# Patient Record
Sex: Male | Born: 1980 | Hispanic: No | Marital: Married | Smoking: Light tobacco smoker
Health system: Southern US, Community
[De-identification: ages and names within clinical notes are randomized; demographics above are authoritative.]

## PROBLEM LIST (undated history)

## (undated) HISTORY — PX: APPENDECTOMY: SHX54

---

## 2012-09-23 ENCOUNTER — Ambulatory Visit (INDEPENDENT_AMBULATORY_CARE_PROVIDER_SITE_OTHER): Payer: Managed Care, Other (non HMO) | Admitting: Internal Medicine

## 2012-09-23 VITALS — BP 122/78 | HR 78 | Temp 97.6°F | Resp 16 | Ht 67.5 in | Wt 173.0 lb

## 2012-09-23 DIAGNOSIS — J069 Acute upper respiratory infection, unspecified: Secondary | ICD-10-CM

## 2012-09-23 DIAGNOSIS — J029 Acute pharyngitis, unspecified: Secondary | ICD-10-CM

## 2012-09-23 LAB — POCT RAPID STREP A (OFFICE): Rapid Strep A Screen: NEGATIVE

## 2012-09-23 NOTE — Progress Notes (Signed)
  Subjective:    Patient ID: Logan Garrison, male    DOB: Nov 01, 1980, 32 y.o.   MRN: [id number]  HPI Here with translator Complaining of two-day history of congestion sore throat No cough No fever  No ongoing illnesses Review of Systems     Objective:   Physical Exam Vital signs stable Eyes clear TMs clear Nares boggy with clear rhinorrhea Oropharynx red with slight exudate 1+ a.c. nodes tender  Rapid strep= Results for orders placed in visit on 09/23/12  POCT RAPID STREP A (OFFICE)      Result Value Range   Rapid Strep A Screen Negative  Negative        Assessment & Plan:  Viral pharyngitis/uri  sudafed

## 2012-12-24 ENCOUNTER — Ambulatory Visit (INDEPENDENT_AMBULATORY_CARE_PROVIDER_SITE_OTHER): Payer: Managed Care, Other (non HMO) | Admitting: Family Medicine

## 2012-12-24 VITALS — BP 108/60 | HR 66 | Temp 97.6°F | Resp 18 | Ht 67.5 in | Wt 167.0 lb

## 2012-12-24 DIAGNOSIS — J329 Chronic sinusitis, unspecified: Secondary | ICD-10-CM

## 2012-12-24 MED ORDER — AMOXICILLIN 875 MG PO TABS: 875.0000 mg | ORAL_TABLET | Freq: Two times a day (BID) | ORAL | Status: DC

## 2012-12-24 NOTE — Progress Notes (Signed)
    Patient ID: Logan Garrison MRN: [id number], DOB: [birth year], 32 y.o. Date of Encounter: 12/24/2012, 10:54 AM  Primary Physician: No PCP Per Patient  Chief Complaint:  Chief Complaint  Patient presents with  . headache, congestion, nose bleeds    x4 months since he came to [place]    HPI: 32 y.o. year old male presents with 7 day history of nasal congestion, post nasal drip, sore throat, sinus pressure, and cough. Afebrile. No chills. Nasal congestion thick and green/yellow. Sinus pressure is the worst symptom. Cough is productive secondary to post nasal drip and not associated with time of day. Ears feel full, leading to sensation of muffled hearing. Has tried OTC cold preps without success. No GI complaints.  H/O nasal fx with deviated septum 5 years ago after soccer mishap.; No recent antibiotics, recent travels, or sick contacts   No leg trauma, sedentary periods, h/o cancer, or tobacco use.  History reviewed. No pertinent past medical history.   Home Meds: Prior to Admission medications   Medication Sig Start Date End Date Taking? Authorizing Provider  amoxicillin (AMOXIL) 875 MG tablet Take 1 tablet (875 mg total) by mouth 2 (two) times daily. 12/24/12   Elvina Sidle, MD    Allergies: No Known Allergies  History   Social History  . Marital Status: Married    Spouse Name: N/A    Number of Children: N/A  . Years of Education: N/A   Occupational History  . Not on file.   Social History Main Topics  . Smoking status: Never Smoker   . Smokeless tobacco: Not on file  . Alcohol Use: No  . Drug Use: No  . Sexually Active: Not on file   Other Topics Concern  . Not on file   Social History Narrative  . No narrative on file     Review of Systems: Constitutional: negative for chills, fever, night sweats or weight changes Cardiovascular: negative for chest pain or palpitations Respiratory: negative for hemoptysis, wheezing, or shortness of breath Abdominal: negative  for abdominal pain, nausea, vomiting or diarrhea Dermatological: negative for rash Neurologic: negative for headache   Physical Exam: Blood pressure 108/60, pulse 66, temperature 97.6 F (36.4 C), temperature source Oral, resp. rate 18, height 5' 7.5" (1.715 m), weight 167 lb (75.751 kg), SpO2 98.00%., Body mass index is 25.75 kg/(m^2). General: Well developed, well nourished, in no acute distress. Head: Normocephalic, atraumatic, eyes without discharge, sclera non-icteric, nares are congested. Bilateral auditory canals clear, TM's are without perforation, pearly grey with reflective cone of light bilaterally. Serous effusion bilaterally behind TM's. Maxillary sinus TTP. Oral cavity moist, dentition normal. Posterior pharynx with post nasal drip and mild erythema. No peritonsillar abscess or tonsillar exudate. Neck: Supple. No thyromegaly. Full ROM. No lymphadenopathy. Lungs: Clear bilaterally to auscultation without wheezes, rales, or rhonchi. Breathing is unlabored.  Heart: RRR with S1 S2. No murmurs, rubs, or gallops appreciated. Msk:  Strength and tone normal for age. Extremities: No clubbing or cyanosis. No edema. Neuro: Alert and oriented X 3. Moves all extremities spontaneously. CNII-XII grossly in tact. Psych:  Responds to questions appropriately with a normal affect.    ASSESSMENT AND PLAN:  32 y.o. year old male with sinusitis Sinusitis - Plan: amoxicillin (AMOXIL) 875 MG tablet   -  -Tylenol/Motrin prn -Rest/fluids -RTC precautions -RTC 3-5 days if no improvement  Signed, Elvina Sidle, MD 12/24/2012 10:54 AM

## 2012-12-24 NOTE — Patient Instructions (Signed)

## 2013-05-21 ENCOUNTER — Ambulatory Visit (INDEPENDENT_AMBULATORY_CARE_PROVIDER_SITE_OTHER): Payer: Managed Care, Other (non HMO) | Admitting: Family Medicine

## 2013-05-21 VITALS — BP 122/82 | HR 69 | Temp 97.9°F | Ht 68.0 in | Wt 170.0 lb

## 2013-05-21 DIAGNOSIS — R509 Fever, unspecified: Secondary | ICD-10-CM

## 2013-05-21 DIAGNOSIS — R059 Cough, unspecified: Secondary | ICD-10-CM

## 2013-05-21 DIAGNOSIS — J069 Acute upper respiratory infection, unspecified: Secondary | ICD-10-CM

## 2013-05-21 DIAGNOSIS — J3489 Other specified disorders of nose and nasal sinuses: Secondary | ICD-10-CM

## 2013-05-21 DIAGNOSIS — R519 Headache, unspecified: Secondary | ICD-10-CM

## 2013-05-21 DIAGNOSIS — R05 Cough: Secondary | ICD-10-CM

## 2013-05-21 MED ORDER — AMOXICILLIN-POT CLAVULANATE 875-125 MG PO TABS: 1.0000 | ORAL_TABLET | Freq: Two times a day (BID) | ORAL | Status: DC

## 2013-05-21 MED ORDER — BENZONATATE 100 MG PO CAPS: 200.0000 mg | ORAL_CAPSULE | Freq: Two times a day (BID) | ORAL | Status: DC | PRN

## 2013-05-21 NOTE — Progress Notes (Signed)
      Chief Complaint:  Chief Complaint  Patient presents with  . Cough    x1 week  . Nasal Congestion    "epistaxis"    HPI: Logan Garrison is a 32 y.o. male who is here for  Productive green sputum, HA, ear pain.  He has ear pain, left side. + cough, facial pressure No fevers ,chills, ear pain, sinus presseure, has chest pain with cough Non smoker No asthma or allergies Not feeling better Has not tried anything for this Denies epistaxis  No past medical history on file. Past Surgical History  Procedure Laterality Date  . Appendectomy     History   Social History  . Marital Status: Married    Spouse Name: N/A    Number of Children: N/A  . Years of Education: N/A   Social History Main Topics  . Smoking status: Never Smoker   . Smokeless tobacco: None  . Alcohol Use: No  . Drug Use: No  . Sexual Activity: None   Other Topics Concern  . None   Social History Narrative  . None   No family history on file. No Known Allergies Prior to Admission medications   Medication Sig Start Date End Date Taking? Authorizing Provider  amoxicillin (AMOXIL) 875 MG tablet Take 1 tablet (875 mg total) by mouth 2 (two) times daily. 12/24/12   Elvina Sidle, MD     ROS: The patient denies fevers, chills, night sweats, unintentional weight loss, chest pain, palpitations, wheezing, dyspnea on exertion, nausea, vomiting, abdominal pain, dysuria, hematuria, melena, numbness, weakness, or tingling.   All other systems have been reviewed and were otherwise negative with the exception of those mentioned in the HPI and as above.    PHYSICAL EXAM: Filed Vitals:   05/21/13 1639  BP: 122/82  Pulse: 69  Temp: 97.9 F (36.6 C)   Filed Vitals:   05/21/13 1639  Height: 5\' 8"  (1.727 m)  Weight: 170 lb (77.111 kg)   Body mass index is 25.85 kg/(m^2).  General: Alert, no acute distress HEENT:  Normocephalic, atraumatic, oropharynx patent. EOMI, PERRLA, no exudates, erythematous  tonsils, + sinus tenderness, TM nl Cardiovascular:  Regular rate and rhythm, no rubs murmurs or gallops.  No Carotid bruits, radial pulse intact. No pedal edema.  Respiratory: Clear to auscultation bilaterally.  No wheezes, rales, or rhonchi.  No cyanosis, no use of accessory musculature GI: No organomegaly, abdomen is soft and non-tender, positive bowel sounds.  No masses. Skin: No rashes. Neurologic: Facial musculature symmetric. Psychiatric: Patient is appropriate throughout our interaction. Lymphatic: No cervical lymphadenopathy Musculoskeletal: Gait intact.   LABS: Results for orders placed in visit on 09/23/12  POCT RAPID STREP A (OFFICE)      Result Value Range   Rapid Strep A Screen Negative  Negative     EKG/XRAY:   Primary read interpreted by Dr. Conley Rolls at [facility].   ASSESSMENT/PLAN: Encounter Diagnoses  Name Primary?  . Cough Yes  . Sinus headache   . URI (upper respiratory infection)    Most likely sinusitis Rx Augmentin, tessalon perles F/u prn  Gross sideeffects, risk and benefits, and alternatives of medications d/w patient. Patient is aware that all medications have potential sideeffects and we are unable to predict every sideeffect or drug-drug interaction that may occur.  Hamilton Capri PHUONG, DO 05/25/2013 3:31 PM

## 2015-07-29 ENCOUNTER — Ambulatory Visit (INDEPENDENT_AMBULATORY_CARE_PROVIDER_SITE_OTHER): Payer: PPO | Admitting: Urgent Care

## 2015-07-29 VITALS — BP 122/74 | HR 79 | Temp 98.0°F | Resp 16 | Ht 68.0 in | Wt 183.4 lb

## 2015-07-29 DIAGNOSIS — Z789 Other specified health status: Secondary | ICD-10-CM | POA: Diagnosis not present

## 2015-07-29 DIAGNOSIS — Z9109 Other allergy status, other than to drugs and biological substances: Secondary | ICD-10-CM

## 2015-07-29 DIAGNOSIS — Z0000 Encounter for general adult medical examination without abnormal findings: Secondary | ICD-10-CM

## 2015-07-29 DIAGNOSIS — J329 Chronic sinusitis, unspecified: Secondary | ICD-10-CM

## 2015-07-29 DIAGNOSIS — Z91048 Other nonmedicinal substance allergy status: Secondary | ICD-10-CM | POA: Diagnosis not present

## 2015-07-29 DIAGNOSIS — F17200 Nicotine dependence, unspecified, uncomplicated: Secondary | ICD-10-CM

## 2015-07-29 LAB — COMPREHENSIVE METABOLIC PANEL
ALK PHOS: 60 U/L (ref 40–115)
ALT: 31 U/L (ref 9–46)
AST: 20 U/L (ref 10–40)
Albumin: 4.5 g/dL (ref 3.6–5.1)
BUN: 17 mg/dL (ref 7–25)
CALCIUM: 9.6 mg/dL (ref 8.6–10.3)
CHLORIDE: 102 mmol/L (ref 98–110)
CO2: 30 mmol/L (ref 20–31)
Creat: 1.16 mg/dL (ref 0.60–1.35)
GLUCOSE: 90 mg/dL (ref 65–99)
POTASSIUM: 4.3 mmol/L (ref 3.5–5.3)
Sodium: 142 mmol/L (ref 135–146)
Total Bilirubin: 0.4 mg/dL (ref 0.2–1.2)
Total Protein: 7.4 g/dL (ref 6.1–8.1)

## 2015-07-29 LAB — CBC
HCT: 48.3 % (ref 39.0–52.0)
Hemoglobin: 17.3 g/dL — ABNORMAL HIGH (ref 13.0–17.0)
MCH: 30.8 pg (ref 26.0–34.0)
MCHC: 35.8 g/dL (ref 30.0–36.0)
MCV: 86.1 fL (ref 78.0–100.0)
MPV: 9.7 fL (ref 8.6–12.4)
Platelets: 242 10*3/uL (ref 150–400)
RBC: 5.61 MIL/uL (ref 4.22–5.81)
RDW: 13.1 % (ref 11.5–15.5)
WBC: 5.8 10*3/uL (ref 4.0–10.5)

## 2015-07-29 LAB — LIPID PANEL
Cholesterol: 185 mg/dL (ref 125–200)
HDL: 39 mg/dL — ABNORMAL LOW
LDL Cholesterol: 114 mg/dL
Total CHOL/HDL Ratio: 4.7 ratio
Triglycerides: 160 mg/dL — ABNORMAL HIGH
VLDL: 32 mg/dL — ABNORMAL HIGH

## 2015-07-29 LAB — TSH: TSH: 1.146 u[IU]/mL (ref 0.350–4.500)

## 2015-07-29 MED ORDER — CETIRIZINE HCL 10 MG PO TABS: 10.0000 mg | ORAL_TABLET | Freq: Every day | ORAL | Status: DC

## 2015-07-29 MED ORDER — AMOXICILLIN 875 MG PO TABS: 875.0000 mg | ORAL_TABLET | Freq: Two times a day (BID) | ORAL | Status: DC

## 2015-07-29 NOTE — Progress Notes (Signed)
MRN: [id number]  Subjective:   Logan Garrison is a 35 y.o. male presenting for annual physical exam.  Medical care team includes: PCP: No PCP Per Patient Specialists: None.  Patient is married with 2 children. He is currently unemployed, his wife works as an Administrator, arts. Has healthy diet, exercises regularly. He currently smokes 1/4 ppd, not interested in quitting. Denies alcohol use.  Sinus issue - admits longstanding history of sinus problems. Today he reports that he feels severely congested, has sinus headaches, sinus pain. Denies ROS as below.  Elihu  does not have a problem list on file.  Eragon has a current medication list which includes the following prescription(s): amoxicillin-clavulanate and benzonatate. He has No Known Allergies.  Arsen  has no past medical history on file. Also  has past surgical history that includes Appendectomy.  His family history includes Diabetes in his father and mother.  Immunizations: States that his TDAP is up to date.  Review of Systems  Constitutional: Negative for fever, chills, weight loss, malaise/fatigue and diaphoresis.  HENT: Positive for congestion. Negative for ear discharge, ear pain, hearing loss, nosebleeds, sore throat and tinnitus.   Eyes: Negative for blurred vision, double vision, photophobia, pain, discharge and redness.  Respiratory: Negative for cough, shortness of breath and wheezing.   Cardiovascular: Negative for chest pain, palpitations and leg swelling.  Gastrointestinal: Negative for nausea, vomiting, abdominal pain, diarrhea, constipation and blood in stool.  Genitourinary: Negative for dysuria, urgency, frequency, hematuria and flank pain.  Musculoskeletal: Negative for myalgias, back pain and joint pain.  Skin: Negative for itching and rash.  Neurological: Negative for dizziness, tingling, seizures, loss of consciousness, weakness and headaches.  Endo/Heme/Allergies: Negative for polydipsia.    Psychiatric/Behavioral: Negative for depression, suicidal ideas, hallucinations, memory loss and substance abuse. The patient is not nervous/anxious and does not have insomnia.    Objective:   Vitals: BP 122/74 mmHg  Pulse 79  Temp(Src) 98 F (36.7 C) (Oral)  Resp 16  Ht  (1.727 m)  Wt 183 lb 6.4 oz (83.19 kg)  BMI 27.89 kg/m2  SpO2 98%  Physical Exam  Constitutional: He is oriented to person, place, and time. He appears well-developed and well-nourished.  HENT:  TM's intact bilaterally, no effusions or erythema. Nasal turbinates erythematous, L>R, with thick white mucus, nasal passage minimally patent on left side. Bilateral maxillary sinus tenderness. Mild postnasal drip present, without oropharyngeal exudates, erythema or abscesses.  Eyes: Conjunctivae and EOM are normal. Pupils are equal, round, and reactive to light. Right eye exhibits no discharge. Left eye exhibits no discharge. No scleral icterus.  Neck: Normal range of motion. Neck supple. No thyromegaly present.  Cardiovascular: Normal rate, regular rhythm and intact distal pulses.  Exam reveals no gallop and no friction rub.   No murmur heard. Pulmonary/Chest: No stridor. No respiratory distress. He has no wheezes. He has no rales.  Abdominal: Soft. Bowel sounds are normal. He exhibits no distension and no mass. There is no tenderness.  Musculoskeletal: Normal range of motion. He exhibits no edema or tenderness.  Lymphadenopathy:    He has no cervical adenopathy.  Neurological: He is alert and oriented to person, place, and time. He has normal reflexes.  Skin: Skin is warm and dry. No rash noted. No erythema. No pallor.  Psychiatric: He has a normal mood and affect.   Assessment and Plan :   1. Annual physical exam - Patient is medically stable, labs pending. - Discussed healthy lifestyle, diet, exercise,  preventative care, vaccinations, and addressed patient's concerns.   2. Chronic sinusitis, unspecified  location - Start amoxicillin, rtc in 1-2 weeks if no improvement.  3. Environmental allergies - Start Zyrtec, suspect that he has had longstanding uncontrolled allergies. May be related to his smoking as well.  4. Tobacco use disorder - Recommended smoking cessation, patient will make efforts to quit smoking.  5. Language barrier  Wallis Bamberg, PA-C [facility] [phone] 07/29/2015  9:13 AM

## 2015-07-29 NOTE — Patient Instructions (Addendum)
Keeping you healthy  Get these tests  Blood pressure- Have your blood pressure checked once a year by your healthcare provider.  Normal blood pressure is 120/80.  Weight- Have your body mass index (BMI) calculated to screen for obesity.  BMI is a measure of body fat based on height and weight. You can also calculate your own BMI at https://www.west-esparza.com/.  Cholesterol- Have your cholesterol checked regularly starting at age 36, sooner may be necessary if you have diabetes, high blood pressure, if a family member developed heart diseases at an early age or if you smoke.   Chlamydia, HIV, and other sexual transmitted disease- Get screened each year until the age of 108 then within three months of each new sexual partner.  Diabetes- Have your blood sugar checked regularly if you have high blood pressure, high cholesterol, a family history of diabetes or if you are overweight.  Get these vaccines  Flu shot- Every fall.  Tetanus shot- Every 10 years.  Menactra- Single dose; prevents meningitis.  Take these steps  Don't smoke- If you do smoke, ask your healthcare provider about quitting. For tips on how to quit, go to www.smokefree.gov or call 1-800-QUIT-NOW.  Be physically active- Exercise 5 days a week for at least 30 minutes.  If you are not already physically active start slow and gradually work up to 30 minutes of moderate physical activity.  Examples of moderate activity include walking briskly, mowing the yard, dancing, swimming bicycling, etc.  Eat a healthy diet- Eat a variety of healthy foods such as fruits, vegetables, low fat milk, low fat cheese, yogurt, lean meats, poultry, fish, beans, tofu, etc.  For more information on healthy eating, go to www.thenutritionsource.org  Drink alcohol in moderation- Limit alcohol intake two drinks or less a day.  Never drink and drive.  Dentist- Brush and floss teeth twice daily; visit your dentis twice a year.  Depression-Your emotional  health is as important as your physical health.  If you're feeling down, losing interest in things you normally enjoy please talk with your healthcare provider.  Gun Safety- If you keep a gun in your home, keep it unloaded and with the safety lock on.  Bullets should be stored separately.  Helmet use- Always wear a helmet when riding a motorcycle, bicycle, rollerblading or skateboarding.  Safe sex- If you may be exposed to a sexually transmitted infection, use a condom  Seat belts- Seat bels can save your life; always wear one.  Smoke/Carbon Monoxide detectors- These detectors need to be installed on the appropriate level of your home.  Replace batteries at least once a year.  Skin Cancer- When out in the sun, cover up and use sunscreen SPF 15 or higher.  Violence- If anyone is threatening or hurting you, please tell your healthcare provider.   RETURN TO OUR CLINIC IN 2 WEEKS IF NO IMPROVEMENT.  Sinusitis, Adult Sinusitis is redness, soreness, and inflammation of the paranasal sinuses. Paranasal sinuses are air pockets within the bones of your face. They are located beneath your eyes, in the middle of your forehead, and above your eyes. In healthy paranasal sinuses, mucus is able to drain out, and air is able to circulate through them by way of your nose. However, when your paranasal sinuses are inflamed, mucus and air can become trapped. This can allow bacteria and other germs to grow and cause infection. Sinusitis can develop quickly and last only a short time (acute) or continue over a long period (chronic). Sinusitis that  lasts for more than 12 weeks is considered chronic. CAUSES Causes of sinusitis include:  Allergies.  Structural abnormalities, such as displacement of the cartilage that separates your nostrils (deviated septum), which can decrease the air flow through your nose and sinuses and affect sinus drainage.  Functional abnormalities, such as when the small hairs (cilia)  that line your sinuses and help remove mucus do not work properly or are not present. SIGNS AND SYMPTOMS Symptoms of acute and chronic sinusitis are the same. The primary symptoms are pain and pressure around the affected sinuses. Other symptoms include:  Upper toothache.  Earache.  Headache.  Bad breath.  Decreased sense of smell and taste.  A cough, which worsens when you are lying flat.  Fatigue.  Fever.  Thick drainage from your nose, which often is green and may contain pus (purulent).  Swelling and warmth over the affected sinuses. DIAGNOSIS Your health care provider will perform a physical exam. During your exam, your health care provider may perform any of the following to help determine if you have acute sinusitis or chronic sinusitis:  Look in your nose for signs of abnormal growths in your nostrils (nasal polyps).  Tap over the affected sinus to check for signs of infection.  View the inside of your sinuses using an imaging device that has a light attached (endoscope). If your health care provider suspects that you have chronic sinusitis, one or more of the following tests may be recommended:  Allergy tests.  Nasal culture. A sample of mucus is taken from your nose, sent to a lab, and screened for bacteria.  Nasal cytology. A sample of mucus is taken from your nose and examined by your health care provider to determine if your sinusitis is related to an allergy. TREATMENT Most cases of acute sinusitis are related to a viral infection and will resolve on their own within 10 days. Sometimes, medicines are prescribed to help relieve symptoms of both acute and chronic sinusitis. These may include pain medicines, decongestants, nasal steroid sprays, or saline sprays. However, for sinusitis related to a bacterial infection, your health care provider will prescribe antibiotic medicines. These are medicines that will help kill the bacteria causing the infection. Rarely,  sinusitis is caused by a fungal infection. In these cases, your health care provider will prescribe antifungal medicine. For some cases of chronic sinusitis, surgery is needed. Generally, these are cases in which sinusitis recurs more than 3 times per year, despite other treatments. HOME CARE INSTRUCTIONS  Drink plenty of water. Water helps thin the mucus so your sinuses can drain more easily.  Use a humidifier.  Inhale steam 3-4 times a day (for example, sit in the bathroom with the shower running).  Apply a warm, moist washcloth to your face 3-4 times a day, or as directed by your health care provider.  Use saline nasal sprays to help moisten and clean your sinuses.  Take medicines only as directed by your health care provider.  If you were prescribed either an antibiotic or antifungal medicine, finish it all even if you start to feel better. SEEK IMMEDIATE MEDICAL CARE IF:  You have increasing pain or severe headaches.  You have nausea, vomiting, or drowsiness.  You have swelling around your face.  You have vision problems.  You have a stiff neck.  You have difficulty breathing.   This information is not intended to replace advice given to you by your health care provider. Make sure you discuss any questions you have  with your health care provider.   Document Released: 06/12/2005 Document Revised: 07/03/2014 Document Reviewed: 06/27/2011 Elsevier Interactive Patient Education 2016 [employer].    Allergies An allergy is an abnormal reaction to a substance by the body's defense system (immune system). Allergies can develop at any age. WHAT CAUSES ALLERGIES? An allergic reaction happens when the immune system mistakenly reacts to a normally harmless substance, called an allergen, as if it were harmful. The immune system releases antibodies to fight the substance. Antibodies eventually release a chemical called histamine into the bloodstream. The release of histamine is  meant to protect the body from infection, but it also causes discomfort. An allergic reaction can be triggered by:  Eating an allergen.  Inhaling an allergen.  Touching an allergen. WHAT TYPES OF ALLERGIES ARE THERE? There are many types of allergies. Common types include:  Seasonal allergies. People with this type of allergy are usually allergic to substances that are only present during certain seasons, such as molds and pollens.  Food allergies.  Drug allergies.  Insect allergies.  Animal dander allergies. WHAT ARE SYMPTOMS OF ALLERGIES? Possible allergy symptoms include:  Swelling of the lips, face, tongue, mouth, or throat.  Sneezing, coughing, or wheezing.  Nasal congestion.  Tingling in the mouth.  Rash.  Itching.  Itchy, red, swollen areas of skin (hives).  Watery eyes.  Vomiting.  Diarrhea.  Dizziness.  Lightheadedness.  Fainting.  Trouble breathing or swallowing.  Chest tightness.  Rapid heartbeat. HOW ARE ALLERGIES DIAGNOSED? Allergies are diagnosed with a medical and family history and one or more of the following:  Skin tests.  Blood tests.  A food diary. A food diary is a record of all the foods and drinks you have in a day and of all the symptoms you experience.  The results of an elimination diet. An elimination diet involves eliminating foods from your diet and then adding them back in one by one to find out if a certain food causes an allergic reaction. HOW ARE ALLERGIES TREATED? There is no cure for allergies, but allergic reactions can be treated with medicine. Severe reactions usually need to be treated at a hospital. HOW CAN REACTIONS BE PREVENTED? The best way to prevent an allergic reaction is by avoiding the substance you are allergic to. Allergy shots and medicines can also help prevent reactions in some cases. People with severe allergic reactions may be able to prevent a life-threatening reaction called anaphylaxis with a  medicine given right after exposure to the allergen.   This information is not intended to replace advice given to you by your health care provider. Make sure you discuss any questions you have with your health care provider.   Document Released: 09/05/2002 Document Revised: 07/03/2014 Document Reviewed: 03/24/2014 Elsevier Interactive Patient Education 2016 [employer].    Smoking Cessation, Tips for Success If you are ready to quit smoking, congratulations! You have chosen to help yourself be healthier. Cigarettes bring nicotine, tar, carbon monoxide, and other irritants into your body. Your lungs, heart, and blood vessels will be able to work better without these poisons. There are many different ways to quit smoking. Nicotine gum, nicotine patches, a nicotine inhaler, or nicotine nasal spray can help with physical craving. Hypnosis, support groups, and medicines help break the habit of smoking. WHAT THINGS CAN I DO TO MAKE QUITTING EASIER?  Here are some tips to help you quit for good:  Pick a date when you will quit smoking completely. Tell all of your  friends and family about your plan to quit on that date.  Do not try to slowly cut down on the number of cigarettes you are smoking. Pick a quit date and quit smoking completely starting on that day.  Throw away all cigarettes.   Clean and remove all ashtrays from your home, work, and car.  On a card, write down your reasons for quitting. Carry the card with you and read it when you get the urge to smoke.  Cleanse your body of nicotine. Drink enough water and fluids to keep your urine clear or pale yellow. Do this after quitting to flush the nicotine from your body.  Learn to predict your moods. Do not let a bad situation be your excuse to have a cigarette. Some situations in your life might tempt you into wanting a cigarette.  Never have "just one" cigarette. It leads to wanting another and another. Remind yourself of your decision  to quit.  Change habits associated with smoking. If you smoked while driving or when feeling stressed, try other activities to replace smoking. Stand up when drinking your coffee. Brush your teeth after eating. Sit in a different chair when you read the paper. Avoid alcohol while trying to quit, and try to drink fewer caffeinated beverages. Alcohol and caffeine may urge you to smoke.  Avoid foods and drinks that can trigger a desire to smoke, such as sugary or spicy foods and alcohol.  Ask people who smoke not to smoke around you.  Have something planned to do right after eating or having a cup of coffee. For example, plan to take a walk or exercise.  Try a relaxation exercise to calm you down and decrease your stress. Remember, you may be tense and nervous for the first 2 weeks after you quit, but this will pass.  Find new activities to keep your hands busy. Play with a pen, coin, or rubber band. Doodle or draw things on paper.  Brush your teeth right after eating. This will help cut down on the craving for the taste of tobacco after meals. You can also try mouthwash.   Use oral substitutes in place of cigarettes. Try using lemon drops, carrots, cinnamon sticks, or chewing gum. Keep them handy so they are available when you have the urge to smoke.  When you have the urge to smoke, try deep breathing.  Designate your home as a nonsmoking area.  If you are a heavy smoker, ask your health care provider about a prescription for nicotine chewing gum. It can ease your withdrawal from nicotine.  Reward yourself. Set aside the cigarette money you save and buy yourself something nice.  Look for support from others. Join a support group or smoking cessation program. Ask someone at home or at work to help you with your plan to quit smoking.  Always ask yourself, "Do I need this cigarette or is this just a reflex?" Tell yourself, "Today, I choose not to smoke," or "I do not want to smoke." You are  reminding yourself of your decision to quit.  Do not replace cigarette smoking with electronic cigarettes (commonly called e-cigarettes). The safety of e-cigarettes is unknown, and some may contain harmful chemicals.  If you relapse, do not give up! Plan ahead and think about what you will do the next time you get the urge to smoke. HOW WILL I FEEL WHEN I QUIT SMOKING? You may have symptoms of withdrawal because your body is used to nicotine (the addictive substance  in cigarettes). You may crave cigarettes, be irritable, feel very hungry, cough often, get headaches, or have difficulty concentrating. The withdrawal symptoms are only temporary. They are strongest when you first quit but will go away within 10-14 days. When withdrawal symptoms occur, stay in control. Think about your reasons for quitting. Remind yourself that these are signs that your body is healing and getting used to being without cigarettes. Remember that withdrawal symptoms are easier to treat than the major diseases that smoking can cause.  Even after the withdrawal is over, expect periodic urges to smoke. However, these cravings are generally short lived and will go away whether you smoke or not. Do not smoke! WHAT RESOURCES ARE AVAILABLE TO HELP ME QUIT SMOKING? Your health care provider can direct you to community resources or hospitals for support, which may include:  Group support.  Education.  Hypnosis.  Therapy.   This information is not intended to replace advice given to you by your health care provider. Make sure you discuss any questions you have with your health care provider.   Document Released: 03/10/2004 Document Revised: 07/03/2014 Document Reviewed: 11/28/2012 Elsevier Interactive Patient Education [employer]

## 2015-08-02 ENCOUNTER — Encounter: Payer: Self-pay | Admitting: Urgent Care

## 2015-08-20 ENCOUNTER — Ambulatory Visit (INDEPENDENT_AMBULATORY_CARE_PROVIDER_SITE_OTHER): Payer: PPO | Admitting: Family Medicine

## 2015-08-20 ENCOUNTER — Ambulatory Visit (INDEPENDENT_AMBULATORY_CARE_PROVIDER_SITE_OTHER): Payer: PPO

## 2015-08-20 VITALS — BP 108/80 | HR 72 | Temp 99.0°F | Resp 18 | Ht 69.5 in | Wt 186.2 lb

## 2015-08-20 DIAGNOSIS — R6889 Other general symptoms and signs: Secondary | ICD-10-CM

## 2015-08-20 DIAGNOSIS — Z131 Encounter for screening for diabetes mellitus: Secondary | ICD-10-CM

## 2015-08-20 DIAGNOSIS — B370 Candidal stomatitis: Secondary | ICD-10-CM | POA: Diagnosis not present

## 2015-08-20 DIAGNOSIS — J101 Influenza due to other identified influenza virus with other respiratory manifestations: Secondary | ICD-10-CM

## 2015-08-20 LAB — POCT CBC
Granulocyte percent: 75.9 %G (ref 37–80)
HEMATOCRIT: 44.6 % (ref 43.5–53.7)
Hemoglobin: 15.8 g/dL (ref 14.1–18.1)
LYMPH, POC: 1.1 (ref 0.6–3.4)
MCH, POC: 30.6 pg (ref 27–31.2)
MCHC: 35.5 g/dL — AB (ref 31.8–35.4)
MCV: 86.2 fL (ref 80–97)
MID (cbc): 0.1 (ref 0–0.9)
MPV: 7.5 fL (ref 0–99.8)
POC GRANULOCYTE: 3.9 (ref 2–6.9)
POC LYMPH %: 21.3 % (ref 10–50)
POC MID %: 2.8 % (ref 0–12)
Platelet Count, POC: 159 10*3/uL (ref 142–424)
RBC: 5.17 M/uL (ref 4.69–6.13)
RDW, POC: 12.6 %
WBC: 5.1 10*3/uL (ref 4.6–10.2)

## 2015-08-20 LAB — POCT INFLUENZA A/B
Influenza A, POC: POSITIVE — AB
Influenza B, POC: NEGATIVE

## 2015-08-20 LAB — GLUCOSE, POCT (MANUAL RESULT ENTRY): POC Glucose: 125 mg/dl — AB (ref 70–99)

## 2015-08-20 MED ORDER — OSELTAMIVIR PHOSPHATE 75 MG PO CAPS: 75.0000 mg | ORAL_CAPSULE | Freq: Two times a day (BID) | ORAL | Status: AC

## 2015-08-20 MED ORDER — MUCINEX DM 30-600 MG PO TB12: 1.0000 | ORAL_TABLET | Freq: Two times a day (BID) | ORAL | Status: AC

## 2015-08-20 MED ORDER — NYSTATIN 100000 UNIT/ML MT SUSP
5.0000 mL | Freq: Four times a day (QID) | OROMUCOSAL | Status: AC
Start: 2015-08-20 — End: ?

## 2015-08-20 NOTE — Progress Notes (Signed)
Subjective:    Patient ID: Logan Garrison, male    DOB: September 23, 1980, 35 y.o.   MRN: [id number]  08/20/2015  Headache; Cough; Nasal Congestion; and Fever   HPI This 35 y.o. male presents for evaluation of headache, cough, nasal congestion, fever.  Onset one week ago.  +fever Tmax 38-39.  +chills/+sweats.  +HA. Some ear pain L.  +ST. +rhinorrhea.  +coughing. No SOB.  Fatigue.  Taking Tylenol and Robitussin.  +nausea; no vomiting; no diarrhea.  +daughter sick first.   Review of Systems Per HPI   History reviewed. No pertinent past medical history. Past Surgical History  Procedure Laterality Date  . Appendectomy     No Known Allergies  Social History   Social History  . Marital Status: Married    Spouse Name: N/A  . Number of Children: N/A  . Years of Education: N/A   Occupational History  . Not on file.   Social History Main Topics  . Smoking status: Light Tobacco Smoker  . Smokeless tobacco: Not on file  . Alcohol Use: No  . Drug Use: No  . Sexual Activity: Not on file   Other Topics Concern  . Not on file   Social History Narrative   Family History  Problem Relation Age of Onset  . Diabetes Mother   . Diabetes Father        Objective:    BP 108/80 mmHg  Pulse 72  Temp(Src) 99 F (37.2 C) (Oral)  Resp 18  Ht 5' 9.5" (1.765 m)  Wt 186 lb 3.2 oz (84.46 kg)  BMI 27.11 kg/m2  SpO2 96% Physical Exam  Constitutional: He is oriented to person, place, and time. He appears well-developed and well-nourished. He appears ill. No distress.  HENT:  Head: Normocephalic and atraumatic.  Right Ear: External ear normal.  Left Ear: External ear normal.  Nose: Nose normal.  Mouth/Throat: Oropharynx is clear and moist.  Eyes: Conjunctivae and EOM are normal. Pupils are equal, round, and reactive to light.  Neck: Normal range of motion. Neck supple. Carotid bruit is not present. No thyromegaly present.  Cardiovascular: Normal rate, regular rhythm, normal heart  sounds and intact distal pulses.  Exam reveals no gallop and no friction rub.   No murmur heard. Pulmonary/Chest: Effort normal and breath sounds normal. He has no wheezes. He has no rales.  Abdominal: Soft. Bowel sounds are normal. He exhibits no distension and no mass. There is no tenderness. There is no rebound and no guarding.  Lymphadenopathy:    He has no cervical adenopathy.  Neurological: He is alert and oriented to person, place, and time. No cranial nerve deficit.  Skin: Skin is warm and dry. No rash noted. He is not diaphoretic.  Psychiatric: He has a normal mood and affect. His behavior is normal.  Nursing note and vitals reviewed.       Assessment & Plan:   1. Influenza A   2. Thrush   3. Flu-like symptoms   4. Screening for diabetes mellitus     Orders Placed This Encounter  Procedures  . Culture, Group A Strep    Order Specific Question:  Source    Answer:  oropharynx  . DG Chest 2 View    Standing Status: Future     Number of Occurrences: 1     Standing Expiration Date: 08/19/2016    Order Specific Question:  Reason for Exam (SYMPTOM  OR DIAGNOSIS REQUIRED)    Answer:  fever for one week;  flu symptoms    Order Specific Question:  Preferred imaging location?    Answer:  External  . POCT CBC  . POCT glucose (manual entry)  . POCT Influenza A/B   Meds ordered this encounter  Medications  . oseltamivir (TAMIFLU) 75 MG capsule    Sig: Take 1 capsule (75 mg total) by mouth 2 (two) times daily.    Dispense:  10 capsule    Refill:  0  . nystatin (MYCOSTATIN) 100000 UNIT/ML suspension    Sig: Take 5 mLs (500,000 Units total) by mouth 4 (four) times daily.    Dispense:  200 mL    Refill:  0  . Dextromethorphan-Guaifenesin (MUCINEX DM) 30-600 MG TB12    Sig: Take 1 tablet by mouth 2 (two) times daily.    Dispense:  28 each    Refill:  0    No Follow-up on file.    Anallely Rosell Paulita Fujita, M.D. [facility] [address] [place], [place]  [id number] [phone] phone [phone] fax

## 2015-08-20 NOTE — Patient Instructions (Addendum)
Because you received an x-ray today, you will receive an invoice from [facility]. Please contact [facility] at [phone] with questions or concerns regarding your invoice. Our billing staff will not be able to assist you with those questions. Influenza, Adult Influenza ("the flu") is a viral infection of the respiratory tract. It occurs more often in winter months because people spend more time in close contact with one another. Influenza can make you feel very sick. Influenza easily spreads from person to person (contagious). CAUSES  Influenza is caused by a virus that infects the respiratory tract. You can catch the virus by breathing in droplets from an infected person's cough or sneeze. You can also catch the virus by touching something that was recently contaminated with the virus and then touching your mouth, nose, or eyes. RISKS AND COMPLICATIONS You may be at risk for a more severe case of influenza if you smoke cigarettes, have diabetes, have chronic heart disease (such as heart failure) or lung disease (such as asthma), or if you have a weakened immune system. Elderly people and pregnant women are also at risk for more serious infections. The most common problem of influenza is a lung infection (pneumonia). Sometimes, this problem can require emergency medical care and may be life threatening. SIGNS AND SYMPTOMS  Symptoms typically last 4 to 10 days and may include:  Fever.  Chills.  Headache, body aches, and muscle aches.  Sore throat.  Chest discomfort and cough.  Poor appetite.  Weakness or feeling tired.  Dizziness.  Nausea or vomiting. DIAGNOSIS  Diagnosis of influenza is often made based on your history and a physical exam. A nose or throat swab test can be done to confirm the diagnosis. TREATMENT  In mild cases, influenza goes away on its own. Treatment is directed at relieving symptoms. For more severe cases, your health care provider may  prescribe antiviral medicines to shorten the sickness. Antibiotic medicines are not effective because the infection is caused by a virus, not by bacteria. HOME CARE INSTRUCTIONS  Take medicines only as directed by your health care provider.  Use a cool mist humidifier to make breathing easier.  Get plenty of rest until your temperature returns to normal. This usually takes 3 to 4 days.  Drink enough fluid to keep your urine clear or pale yellow.  Cover yourmouth and nosewhen coughing or sneezing,and wash your handswellto prevent thevirusfrom spreading.  Stay homefromwork orschool untilthe fever is gonefor at least 1full day. PREVENTION  An annual influenza vaccination (flu shot) is the best way to avoid getting influenza. An annual flu shot is now routinely recommended for all adults in the [place] SEEK MEDICAL CARE IF:  You experiencechest pain, yourcough worsens,or you producemore mucus.  Youhave nausea,vomiting, ordiarrhea.  Your fever returns or gets worse. SEEK IMMEDIATE MEDICAL CARE IF:  You havetrouble breathing, you become short of breath,or your skin ornails becomebluish.  You have severe painor stiffnessin the neck.  You develop a sudden headache, or pain in the face or ear.  You have nausea or vomiting that you cannot control. MAKE SURE YOU:   Understand these instructions.  Will watch your condition.  Will get help right away if you are not doing well or get worse.   This information is not intended to replace advice given to you by your health care provider. Make sure you discuss any questions you have with your health care provider.   Document Released: 06/09/2000 Document Revised: 07/03/2014 Document Reviewed: 09/11/2011 Elsevier Interactive   Patient Education 2016 [employer] Inc[employer]  

## 2015-08-22 LAB — CULTURE, GROUP A STREP: ORGANISM ID, BACTERIA: NORMAL

## 2015-08-24 ENCOUNTER — Ambulatory Visit (INDEPENDENT_AMBULATORY_CARE_PROVIDER_SITE_OTHER): Payer: PPO | Admitting: Physician Assistant

## 2015-08-24 VITALS — BP 120/80 | HR 74 | Temp 98.2°F | Resp 18 | Ht 69.5 in | Wt 181.2 lb

## 2015-08-24 DIAGNOSIS — Z09 Encounter for follow-up examination after completed treatment for conditions other than malignant neoplasm: Secondary | ICD-10-CM

## 2015-08-24 DIAGNOSIS — J069 Acute upper respiratory infection, unspecified: Secondary | ICD-10-CM | POA: Diagnosis not present

## 2015-08-24 LAB — POCT CBC
Granulocyte percent: 42.7 %G (ref 37–80)
HEMATOCRIT: 44.5 % (ref 43.5–53.7)
Hemoglobin: 15.8 g/dL (ref 14.1–18.1)
LYMPH, POC: 1.4 (ref 0.6–3.4)
MCH, POC: 30.3 pg (ref 27–31.2)
MCHC: 35.6 g/dL — AB (ref 31.8–35.4)
MCV: 85.3 fL (ref 80–97)
MID (cbc): 0.4 (ref 0–0.9)
MPV: 7.4 fL (ref 0–99.8)
PLATELET COUNT, POC: 130 10*3/uL — AB (ref 142–424)
POC Granulocyte: 1.3 — AB (ref 2–6.9)
POC LYMPH %: 45.9 % (ref 10–50)
POC MID %: 11.4 %M (ref 0–12)
RBC: 5.22 M/uL (ref 4.69–6.13)
RDW, POC: 12.5 %
WBC: 3.1 10*3/uL — AB (ref 4.6–10.2)

## 2015-08-24 LAB — HEPATIC FUNCTION PANEL
ALK PHOS: 52 U/L (ref 40–115)
ALT: 21 U/L (ref 9–46)
AST: 18 U/L (ref 10–40)
Albumin: 4.1 g/dL (ref 3.6–5.1)
BILIRUBIN DIRECT: 0.1 mg/dL (ref <=0.2)
BILIRUBIN INDIRECT: 0.3 mg/dL (ref 0.2–1.2)
BILIRUBIN TOTAL: 0.4 mg/dL (ref 0.2–1.2)
Total Protein: 7.2 g/dL (ref 6.1–8.1)

## 2015-08-24 MED ORDER — METHYLPREDNISOLONE ACETATE 80 MG/ML IJ SUSP
80.0000 mg | Freq: Once | INTRAMUSCULAR | Status: AC
  Administered 2015-08-24: 80 mg via INTRAMUSCULAR

## 2015-08-24 MED ORDER — HYDROCODONE-HOMATROPINE 5-1.5 MG/5ML PO SYRP: 2.5000 mL | ORAL_SOLUTION | Freq: Every evening | ORAL | Status: AC | PRN

## 2015-08-24 MED ORDER — NAPROXEN 500 MG PO TABS: 500.0000 mg | ORAL_TABLET | Freq: Two times a day (BID) | ORAL | Status: AC

## 2015-08-24 NOTE — Progress Notes (Signed)
08/24/2015 1:30 PM   DOB: 1980-09-09 / MRN: [id number]  SUBJECTIVE:  Logan Garrison is a 35 y.o. male presenting for 1 months of cough, sore throat, fatigue, and nasal congestion.  Was seen for same roughly 1 week ago and that treatment plan did not help.  He had a similar episode during this time last year.    He has No Known Allergies.   He  has no past medical history on file.    He  reports that he has been smoking.  He does not have any smokeless tobacco history on file. He reports that he does not drink alcohol or use illicit drugs. He  has no sexual activity history on file. The patient  has past surgical history that includes Appendectomy.  His family history includes Diabetes in his father and mother.  Review of Systems  Constitutional: Positive for malaise/fatigue. Negative for fever, chills and diaphoresis.  HENT: Positive for congestion and sore throat.   Respiratory: Positive for cough. Negative for hemoptysis, shortness of breath and wheezing.   Cardiovascular: Negative for chest pain.  Gastrointestinal: Negative for nausea.  Skin: Negative for rash.  Neurological: Negative for dizziness and weakness.  Endo/Heme/Allergies: Negative for polydipsia.    Problem list and medications reviewed and updated by myself where necessary, and exist elsewhere in the encounter.   OBJECTIVE:  BP 120/80 mmHg  Pulse 74  Temp(Src) 98.2 F (36.8 C) (Oral)  Resp 18  Ht 5' 9.5" (1.765 m)  Wt 181 lb 3.2 oz (82.192 kg)  BMI 26.38 kg/m2  SpO2 98%  Physical Exam  Constitutional: He is oriented to person, place, and time. He appears well-developed. He does not appear ill.  Eyes: Conjunctivae and EOM are normal. Pupils are equal, round, and reactive to light.  Cardiovascular: Normal rate and regular rhythm.   Pulmonary/Chest: Effort normal and breath sounds normal. No respiratory distress. He has no wheezes. He has no rales. He exhibits no tenderness.  Abdominal: He exhibits no  distension.  Musculoskeletal: Normal range of motion.  Neurological: He is alert and oriented to person, place, and time. No cranial nerve deficit. Coordination normal.  Skin: Skin is warm and dry. He is not diaphoretic.  Psychiatric: He has a normal mood and affect.  Nursing note and vitals reviewed.   Results for orders placed or performed in visit on 08/24/15 (from the past 72 hour(s))  POCT CBC     Status: Abnormal   Collection Time: 08/24/15  9:49 AM  Result Value Ref Range   WBC 3.1 (A) 4.6 - 10.2 K/uL   Lymph, poc 1.4 0.6 - 3.4   POC LYMPH PERCENT 45.9 10 - 50 %L   MID (cbc) 0.4 0 - 0.9   POC MID % 11.4 0 - 12 %M   POC Granulocyte 1.3 (A) 2 - 6.9   Granulocyte percent 42.7 37 - 80 %G   RBC 5.22 4.69 - 6.13 M/uL   Hemoglobin 15.8 14.1 - 18.1 g/dL   HCT, POC 40.9 81.1 - 53.7 %   MCV 85.3 80 - 97 fL   MCH, POC 30.3 27 - 31.2 pg   MCHC 35.6 (A) 31.8 - 35.4 g/dL   RDW, POC 91.4 %   Platelet Count, POC 130 (A) 142 - 424 K/uL   MPV 7.4 0 - 99.8 fL    No results found.  ASSESSMENT AND PLAN  Sevan was seen today for cough and throat feels swollen.  Diagnoses and all orders for this  visit:  Follow up: Thrombocytopenia noted on CBC.  This is mild and appears to be an incidental finding.  He is not complaining of bruising or bleeding.  Will send for smear and LFT's to sort this out.   -     POCT CBC -     Pathologist smear review -     Hepatic Function Panel  Protracted URI: It is difficult to tell if this is allergy or a new viral infection.  Will treat with Depomedrol here and will start NSAID tomorrow. Zyrtec daily.   RTC in 5 days if symptomatic.  -     methylPREDNISolone acetate (DEPO-MEDROL) injection 80 mg; Inject 1 mL (80 mg total) into the muscle once.    The patient was advised to call or return to clinic if he does not see an improvement in symptoms or to seek the care of the closest emergency department if he worsens with the above plan.   Deliah Boston,  MHS, PA-C [facility] 08/24/2015 1:30 PM

## 2015-08-25 LAB — PATHOLOGIST SMEAR REVIEW

## 2017-11-12 IMAGING — CR DG CHEST 2V
2 series · 2 of 2 positions shown · non-contrast
Comparison: None.

CLINICAL DATA: Flu like symptoms.  Fever and short of breath

EXAM:
CHEST  2 VIEW

[PA]
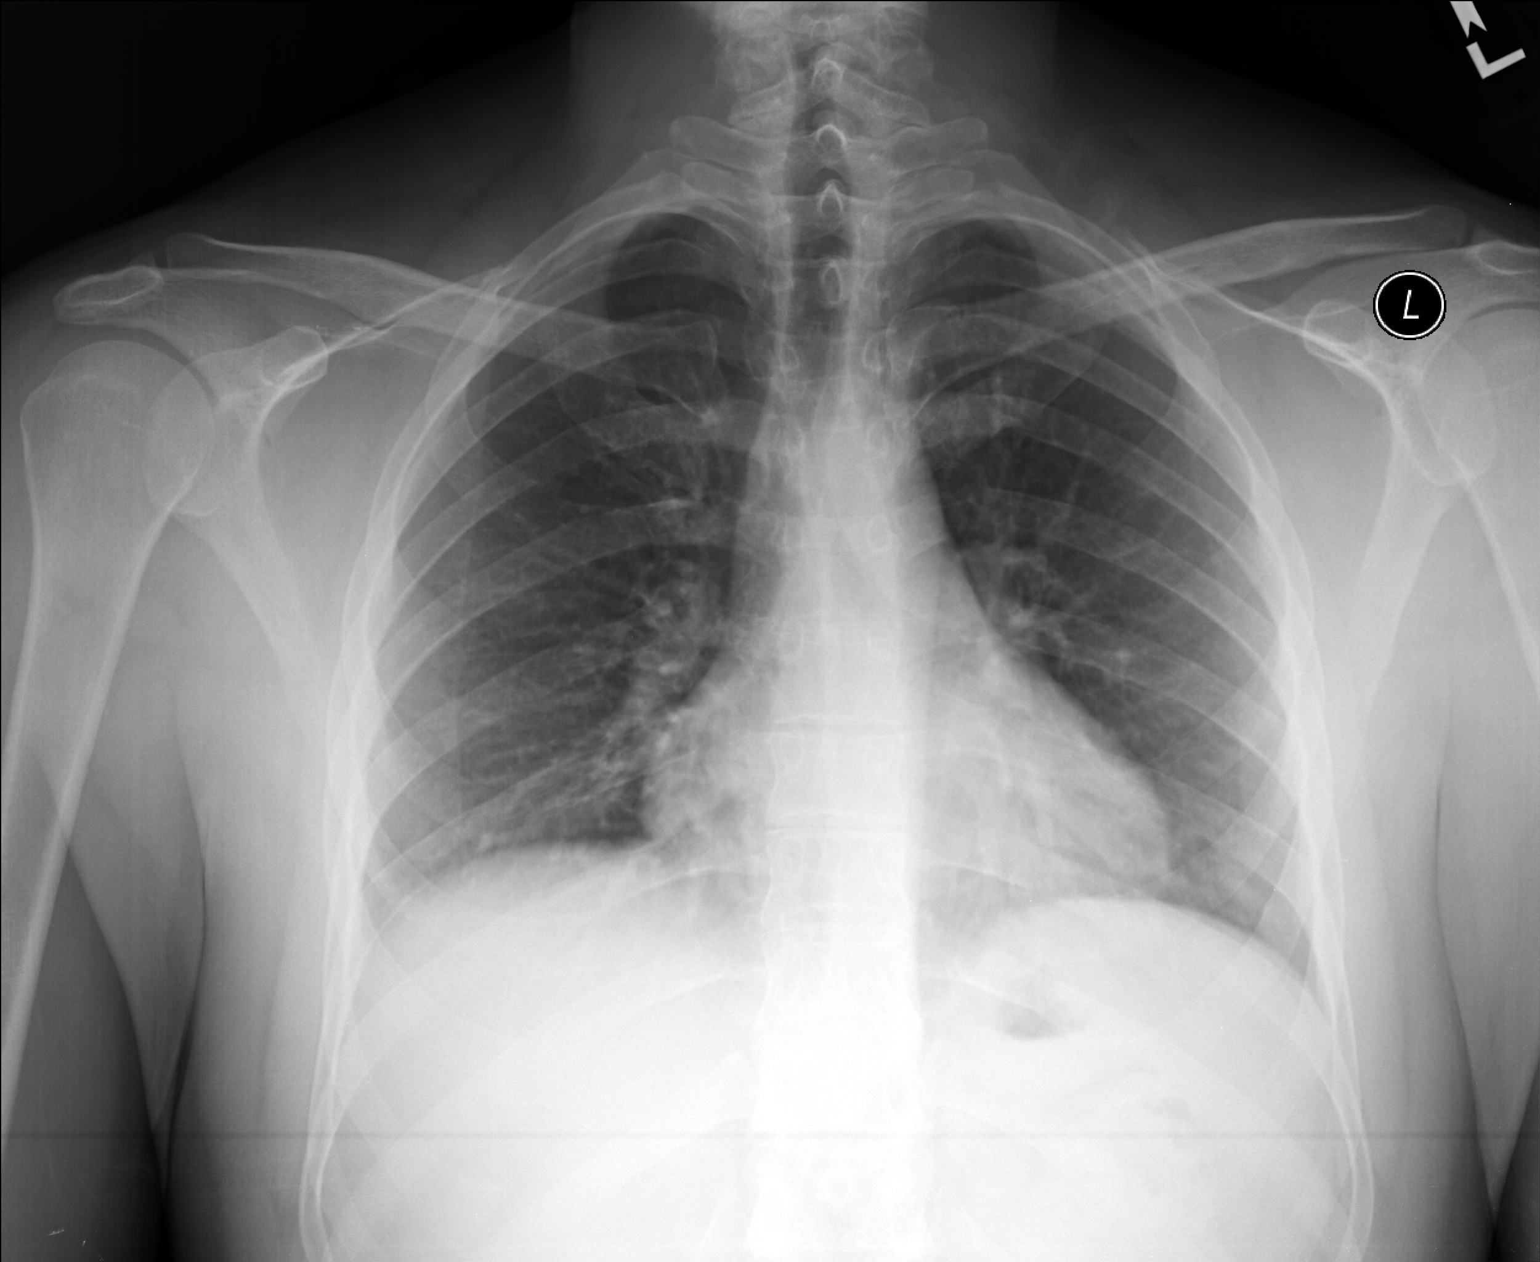

[lateral]
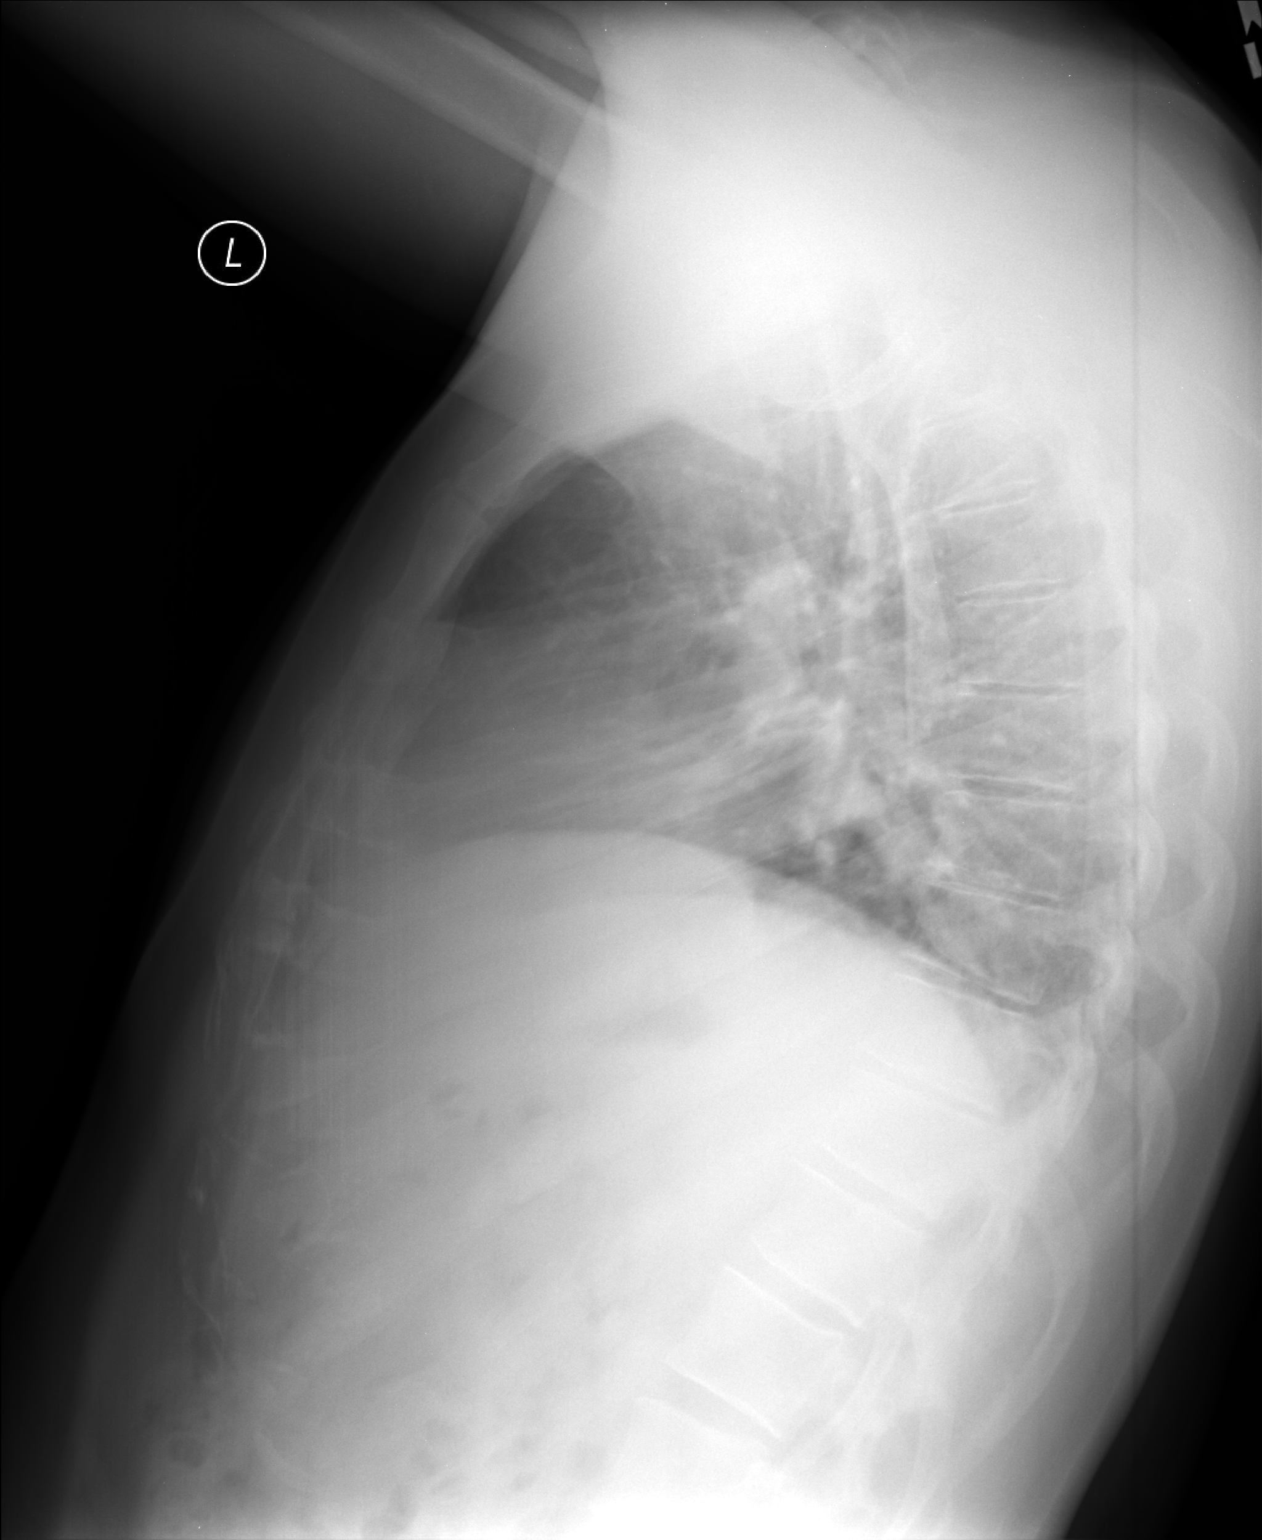

[2 of 2 positions shown; findings below may reference images not displayed]

FINDINGS: Heart size upper normal.  Negative for heart failure.

Mild motion on both views. There is mild airspace disease in the
left lower lobe which may be atelectasis or possibly pneumonia. No
effusion. Negative for mass lesion.
IMPRESSION: Mild left lower lobe atelectasis/infiltrate.  Favor atelectasis.
